# Patient Record
Sex: Male | Born: 1975 | Race: Asian | Hispanic: No | Marital: Married | Smoking: Never smoker
Health system: Southern US, Community
[De-identification: ages and names within clinical notes are randomized; demographics above are authoritative.]

---

## 2009-08-24 ENCOUNTER — Emergency Department (HOSPITAL_BASED_OUTPATIENT_CLINIC_OR_DEPARTMENT_OTHER): Admission: EM | Admit: 2009-08-24 | Discharge: 2009-08-24 | Payer: Self-pay | Admitting: Emergency Medicine

## 2011-03-17 ENCOUNTER — Ambulatory Visit (INDEPENDENT_AMBULATORY_CARE_PROVIDER_SITE_OTHER): Payer: BC Managed Care – PPO

## 2011-03-17 DIAGNOSIS — L251 Unspecified contact dermatitis due to drugs in contact with skin: Secondary | ICD-10-CM

## 2011-05-06 ENCOUNTER — Ambulatory Visit (INDEPENDENT_AMBULATORY_CARE_PROVIDER_SITE_OTHER): Payer: BC Managed Care – PPO

## 2011-05-06 DIAGNOSIS — J209 Acute bronchitis, unspecified: Secondary | ICD-10-CM

## 2011-09-15 ENCOUNTER — Ambulatory Visit (INDEPENDENT_AMBULATORY_CARE_PROVIDER_SITE_OTHER): Payer: BC Managed Care – PPO | Admitting: Emergency Medicine

## 2011-09-15 ENCOUNTER — Ambulatory Visit: Payer: BC Managed Care – PPO

## 2011-09-15 VITALS — BP 130/77 | HR 57 | Temp 98.1°F | Resp 16 | Ht 65.5 in | Wt 187.0 lb

## 2011-09-15 DIAGNOSIS — R05 Cough: Secondary | ICD-10-CM

## 2011-09-15 LAB — POCT CBC
Granulocyte percent: 66.3 %G (ref 37–80)
HCT, POC: 47 % (ref 43.5–53.7)
Hemoglobin: 15.2 g/dL (ref 14.1–18.1)
Lymph, poc: 1.8 (ref 0.6–3.4)
MCHC: 32.3 g/dL (ref 31.8–35.4)
MCV: 75.3 fL — AB (ref 80–97)
POC Granulocyte: 4.7 (ref 2–6.9)
POC LYMPH PERCENT: 25.5 %L (ref 10–50)
RDW, POC: 16.4 %

## 2011-09-15 LAB — POCT SEDIMENTATION RATE: POCT SED RATE: 10 mm/hr (ref 0–22)

## 2011-09-15 MED ORDER — AZITHROMYCIN 250 MG PO TABS: ORAL_TABLET | ORAL | Status: AC

## 2011-09-15 MED ORDER — ALBUTEROL SULFATE HFA 108 (90 BASE) MCG/ACT IN AERS: 2.0000 | INHALATION_SPRAY | RESPIRATORY_TRACT | Status: DC | PRN

## 2011-09-15 NOTE — Progress Notes (Signed)
  Subjective:    Patient ID: Ricky Jacobs, male    DOB: 12/26/1975, 36 y.o.   MRN: [id number]  HPI Mr. Dames comes in today for a cough for 1 week and no other symptoms.  He states that his left side of chest feels tight when he coughs but not painful. He has no SOB or wheezing.  He has had no congestion or ST.  No fever or chills, n/v/d.  He has taken OTC meds without relief.  He is a non smoker.     Review of Systems As noted in HPI    Objective:   Physical Exam  Constitutional: He is oriented to person, place, and time. He appears well-developed and well-nourished.  HENT:  Right Ear: Tympanic membrane normal.  Left Ear: Tympanic membrane normal.  Nose: Nose normal.  Mouth/Throat: Oropharynx is clear and moist.  Cardiovascular: Normal rate and regular rhythm.   Pulmonary/Chest: Effort normal and breath sounds normal.  Lymphadenopathy:    He has no cervical adenopathy.  Neurological: He is alert and oriented to person, place, and time.      Results for orders placed in visit on 09/15/11  POCT CBC      Component Value Range   WBC 7.1  4.6 - 10.2 (K/uL)   Lymph, poc 1.8  0.6 - 3.4    POC LYMPH PERCENT 25.5  10 - 50 (%L)   MID (cbc) 0.6  0 - 0.9    POC MID % 8.2  0 - 12 (%M)   POC Granulocyte 4.7  2 - 6.9    Granulocyte percent 66.3  37 - 80 (%G)   RBC 6.24 (*) 4.69 - 6.13 (M/uL)   Hemoglobin 15.2  14.1 - 18.1 (g/dL)   HCT, POC 40.9  81.1 - 53.7 (%)   MCV 75.3 (*) 80 - 97 (fL)   MCH, POC 24.4 (*) 27 - 31.2 (pg)   MCHC 32.3  31.8 - 35.4 (g/dL)   RDW, POC 91.4     Platelet Count, POC 291  142 - 424 (K/uL)   MPV 8.7  0 - 99.8 (fL)   [facility] reading (PRIMARY) by  Dr. Cleta Alberts CXR Prominent hilar markings    Assessment & Plan:  Cough  Sed Rate and ACE level Zpack and Albuterol Inhaler Watch for SOB, CP or worsening symptoms.  Discussed with Dr. Cleta Alberts

## 2011-09-16 LAB — ANGIOTENSIN CONVERTING ENZYME: Angiotensin-Converting Enzyme: 37 U/L (ref 8–52)

## 2012-04-04 ENCOUNTER — Ambulatory Visit: Payer: BC Managed Care – PPO

## 2012-04-04 ENCOUNTER — Ambulatory Visit (INDEPENDENT_AMBULATORY_CARE_PROVIDER_SITE_OTHER): Payer: BC Managed Care – PPO | Admitting: Family Medicine

## 2012-04-04 VITALS — BP 131/84 | HR 57 | Temp 97.9°F | Resp 16 | Ht 65.5 in | Wt 185.4 lb

## 2012-04-04 DIAGNOSIS — T148XXA Other injury of unspecified body region, initial encounter: Secondary | ICD-10-CM

## 2012-04-04 DIAGNOSIS — M542 Cervicalgia: Secondary | ICD-10-CM

## 2012-04-04 DIAGNOSIS — M545 Low back pain, unspecified: Secondary | ICD-10-CM

## 2012-04-04 DIAGNOSIS — M1990 Unspecified osteoarthritis, unspecified site: Secondary | ICD-10-CM

## 2012-04-04 DIAGNOSIS — M129 Arthropathy, unspecified: Secondary | ICD-10-CM

## 2012-04-04 MED ORDER — CYCLOBENZAPRINE HCL 5 MG PO TABS: 5.0000 mg | ORAL_TABLET | Freq: Every evening | ORAL | Status: DC | PRN

## 2012-04-04 MED ORDER — MELOXICAM 7.5 MG PO TABS: 7.5000 mg | ORAL_TABLET | Freq: Every day | ORAL | Status: DC | PRN
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
## 2012-04-05 ENCOUNTER — Encounter: Payer: Self-pay | Admitting: Family Medicine

## 2012-10-25 ENCOUNTER — Ambulatory Visit (INDEPENDENT_AMBULATORY_CARE_PROVIDER_SITE_OTHER): Payer: 59 | Admitting: Emergency Medicine

## 2012-10-25 VITALS — BP 110/80 | HR 70 | Temp 98.1°F | Resp 18 | Wt 191.0 lb

## 2012-10-25 DIAGNOSIS — J0100 Acute maxillary sinusitis, unspecified: Secondary | ICD-10-CM

## 2012-10-25 DIAGNOSIS — J018 Other acute sinusitis: Secondary | ICD-10-CM

## 2012-10-25 MED ORDER — AMOXICILLIN-POT CLAVULANATE 875-125 MG PO TABS: 1.0000 | ORAL_TABLET | Freq: Two times a day (BID) | ORAL | Status: DC

## 2012-10-25 MED ORDER — PSEUDOEPHEDRINE-GUAIFENESIN ER 60-600 MG PO TB12: 1.0000 | ORAL_TABLET | Freq: Two times a day (BID) | ORAL | Status: DC

## 2012-10-25 NOTE — Progress Notes (Signed)
  Subjective:     Ricky Jacobs is a 37 y.o. male who presents for evaluation of sinus pain. Symptoms include: cough, facial pain, frequent clearing of the throat, headaches, nasal congestion, post nasal drip, purulent rhinorrhea, sinus pressure and sore throat. Onset of symptoms was 1 week ago. Symptoms have been gradually worsening since that time. Past history is significant for no history of pneumonia or bronchitis. Patient is a non-smoker.  The following portions of the patient's history were reviewed and updated as appropriate: allergies, current medications, past family history, past medical history, past social history, past surgical history and problem list.  Review of Systems A comprehensive review of systems was negative.   Objective:    BP 110/80  Pulse 70  Temp(Src) 98.1 F (36.7 C) (Oral)  Resp 18  Wt 191 lb (86.637 kg)  BMI 31.29 kg/m2  SpO2 98%  General Appearance:    Alert, cooperative, no distress, appears stated age  Head:    Normocephalic, without obvious abnormality, atraumatic  Eyes:    PERRL, conjunctiva/corneas clear, EOM's intact, fundi    benign, both eyes       Ears:    Normal TM's and external ear canals, both ears  Nose:   Nares normal, septum midline, mucosa normal, no drainage    or sinus tenderness  Throat:   Lips, mucosa, and tongue normal; teeth and gums normal  Neck:   Supple, symmetrical, trachea midline, no adenopathy;       thyroid:  No enlargement/tenderness/nodules; no carotid   bruit or JVD  Back:     Symmetric, no curvature, ROM normal, no CVA tenderness  Lungs:     Clear to auscultation bilaterally, respirations unlabored  Chest wall:    No tenderness or deformity  Heart:    Regular rate and rhythm, S1 and S2 normal, no murmur, rub   or gallop  Abdomen:     Soft, non-tender, bowel sounds active all four quadrants,    no masses, no organomegaly        Extremities:   Extremities normal, atraumatic, no cyanosis or edema  Pulses:   2+ and  symmetric all extremities  Skin:   Skin color, texture, turgor normal, no rashes or lesions  Lymph nodes:   Cervical, supraclavicular, and axillary nodes normal  Neurologic:   CNII-XII intact. Normal strength, sensation and reflexes      throughout      Assessment:    Acute bacterial sinusitis.    Plan:    Augmentin per medication orders.

## 2012-10-25 NOTE — Patient Instructions (Addendum)

## 2013-06-19 ENCOUNTER — Ambulatory Visit (INDEPENDENT_AMBULATORY_CARE_PROVIDER_SITE_OTHER): Payer: 59 | Admitting: Family Medicine

## 2013-06-19 VITALS — BP 118/86 | HR 52 | Temp 98.3°F | Resp 16 | Ht 65.5 in | Wt 195.8 lb

## 2013-06-19 DIAGNOSIS — T7840XA Allergy, unspecified, initial encounter: Secondary | ICD-10-CM

## 2013-06-19 DIAGNOSIS — L299 Pruritus, unspecified: Secondary | ICD-10-CM

## 2013-06-19 MED ORDER — EPINEPHRINE 0.3 MG/0.3ML IJ SOAJ: 0.3000 mg | Freq: Once | INTRAMUSCULAR | Status: AC

## 2013-06-19 MED ORDER — PREDNISONE 20 MG PO TABS: ORAL_TABLET | ORAL | Status: DC

## 2013-06-19 NOTE — Patient Instructions (Signed)
You are having an allergic reaction to something- we may never be able to find out what caused this reaction!  Take the prednisone according to the directions. This should help with the rash and itching Keep an epipen on hand and look online for more information and videos https://www.epipen.com/en/about-epipen/how-to-use-epipen  Continue to use benadryl as needed for itching- remember this can make you feel sleepy. You can also take a zyrtec once a day, and a zantac once a day for about 2 weeks.  Let me know if you do not feel better soon- Sooner if worse.   IF you have swelling of your lips or tongue or cannot breathe use the epipen and call 911

## 2013-06-19 NOTE — Progress Notes (Signed)
[facility] [address], [place] [address] [phone]  Date:  06/19/2013   Name:  Adella NissenHan Norris   DOB:  08/29/1975   MRN:  [id number]  PCP:  No primary provider on file.    Chief Complaint: Pruritis   History of Present Illness:  Ricky Jacobs is a 38 y.o. very pleasant male patient who presents with the following:  He is here with itchy skin for the last 5 days.  It also seems that his skin will get red, and when he scratches he will have red spots.  He notes this all over his body. He has small patches of itching that move around Otherwise he feels well- no fever.   He is generally healthy.   No yard work.  No new medications.  He is not aware of any new contacts such as detergents, etc.  No recent antibiotic use No SOB, no cough, no swelling.   He has tried some benadryl- this helped a little bit.   No new foods that he can think of.    He has a history of allergies with runny nose, etc.     There are no active problems to display for this patient.   History reviewed. No pertinent past medical history.  History reviewed. No pertinent past surgical history.  History  Substance Use Topics  . Smoking status: Never Smoker   . Smokeless tobacco: Not on file  . Alcohol Use: No    No family history on file.  No Known Allergies  Medication list has been reviewed and updated.  No current outpatient prescriptions on file prior to visit.   No current facility-administered medications on file prior to visit.    Review of Systems:  As per HPI- otherwise negative.   Physical Examination: Filed Vitals:   06/19/13 1356  BP: 118/86  Pulse: 52  Temp: 98.3 F (36.8 C)  Resp: 16   Filed Vitals:   06/19/13 1356  Height: 5' 5.5" (1.664 m)  Weight: 195 lb 12.8 oz (88.814 kg)   Body mass index is 32.08 kg/(m^2). Ideal Body Weight: Weight in (lb) to have BMI = 25: 152.2  GEN: WDWN, NAD, Non-toxic, A & O x 3, looks well HEENT: Atraumatic,  Normocephalic. Neck supple. No masses, No LAD.  Bilateral TM wnl, oropharynx normal.  PEERL,EOMI.  No angioedema Ears and Nose: No external deformity. CV: RRR, No M/G/R. No JVD. No thrill. No extra heart sounds. PULM: CTA B, no wheezes, crackles, rhonchi. No retractions. No resp. distress. No accessory muscle use. EXTR: No c/c/e NEURO Normal gait.  PSYCH: Normally interactive. Conversant. Not depressed or anxious appearing.  Calm demeanor.  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  No urticaria.  No other rash  Assessment and Plan: Allergic reaction - Plan: predniSONE (DELTASONE) 20 MG tablet, EPINEPHrine (EPIPEN) 0.3 mg/0.3 mL SOAJ injection  Itching  Allergic reaction of unknown etiology. No evidence of dangerous reaction at this time Treat with prednisone, epipen to have in case of emergency See patient instructions for more details.    Meds ordered this encounter  Medications  . predniSONE (DELTASONE) 20 MG tablet    Sig: Take 2 pills a day for 4 days, then 1 pill a day for 4 days    Dispense:  12 tablet    Refill:  0  . EPINEPHrine (EPIPEN) 0.3  mg/0.3 mL SOAJ injection    Sig: Inject 0.3 mLs (0.3 mg total) into the muscle once.    Dispense:  2 Device    Refill:  2     Signed Abbe Amsterdam, MD

## 2013-08-19 ENCOUNTER — Encounter (HOSPITAL_BASED_OUTPATIENT_CLINIC_OR_DEPARTMENT_OTHER): Payer: Self-pay | Admitting: Emergency Medicine

## 2013-08-19 ENCOUNTER — Emergency Department (HOSPITAL_BASED_OUTPATIENT_CLINIC_OR_DEPARTMENT_OTHER)
Admission: EM | Admit: 2013-08-19 | Discharge: 2013-08-19 | Disposition: A | Discharge status: Home / Self Care | Payer: 59 | Attending: Emergency Medicine | Admitting: Emergency Medicine

## 2013-08-19 DIAGNOSIS — L509 Urticaria, unspecified: Secondary | ICD-10-CM | POA: Insufficient documentation

## 2013-08-19 MED ORDER — PREDNISONE 10 MG PO TABS: 20.0000 mg | ORAL_TABLET | Freq: Every day | ORAL | Status: DC

## 2013-08-19 MED ORDER — HYDROXYZINE HCL 25 MG PO TABS: 25.0000 mg | ORAL_TABLET | Freq: Four times a day (QID) | ORAL | Status: DC

## 2013-08-19 NOTE — Discharge Instructions (Signed)
Hives Hives are itchy, red, puffy (swollen) areas of the skin. Hives can change in size and location on your body. Hives can come and go for hours, days, or weeks. Hives do not spread from person to person (noncontagious). Scratching, exercise, and stress can make your hives worse. HOME CARE  Avoid things that cause your hives (triggers).  Take antihistamine medicines as told by your doctor. Do not drive while taking an antihistamine.  Take any other medicines for itching as told by your doctor.  Wear loose-fitting clothing.  Keep all doctor visits as told. GET HELP RIGHT AWAY IF:   You have a fever.  Your tongue or lips are puffy.  You have trouble breathing or swallowing.  You feel tightness in the throat or chest.  You have belly (abdominal) pain.  You have lasting or severe itching that is not helped by medicine.  You have painful or puffy joints. These problems may be the fiHypertension As your heart beats, it forces blood through your arteries. This force is your blood pressure. If the pressure is too high, it is called hypertension (HTN) or high blood pressure. HTN is dangerous because you may have it and not know it. High blood pressure may mean that your heart has to work harder to pump blood. Your arteries may be narrow or stiff. The extra work puts you at risk for heart disease, stroke, and other problems.  Blood pressure consists of two numbers, a higher number over a lower, 110/72, for example. It is stated as "110 over 72." The ideal is below 120 for the top number (systolic) and under 80 for the bottom (diastolic). Write down your blood pressure today. You should pay close attention to your blood pressure if you have certain conditions such as:  Heart failure.  Prior heart attack.  Diabetes  Chronic kidney disease.  Prior stroke.  Multiple risk factors for heart disease. To see if you have HTN, your blood pressure should be measured while you are seated with  your arm held at the level of the heart. It should be measured at least twice. A one-time elevated blood pressure reading (especially in the Emergency Department) does not mean that you need treatment. There may be conditions in which the blood pressure is different between your right and left arms. It is important to see your caregiver soon for a recheck. Most people have essential hypertension which means that there is not a specific cause. This type of high blood pressure may be lowered by changing lifestyle factors such as:  Stress.  Smoking.  Lack of exercise.  Excessive weight.  Drug/tobacco/alcohol use.  Eating less salt. Most people do not have symptoms from high blood pressure until it has caused damage to the body. Effective treatment can often prevent, delay or reduce that damage. TREATMENT  When a cause has been identified, treatment for high blood pressure is directed at the cause. There are a large number of medications to treat HTN. These fall into several categories, and your caregiver will help you select the medicines that are best for you. Medications may have side effects. You should review side effects with your caregiver. If your blood pressure stays high after you have made lifestyle changes or started on medicines,   Your medication(s) may need to be changed.  Other problems may need to be addressed.  Be certain you understand your prescriptions, and know how and when to take your medicine.  Be sure to follow up with your  caregiver within the time frame advised (usually within two weeks) to have your blood pressure rechecked and to review your medications.  If you are taking more than one medicine to lower your blood pressure, make sure you know how and at what times they should be taken. Taking two medicines at the same time can result in blood pressure that is too low. SEEK IMMEDIATE MEDICAL CARE IF:  You develop a severe headache, blurred or changing vision,  or confusion.  You have unusual weakness or numbness, or a faint feeling.  You have severe chest or abdominal pain, vomiting, or breathing problems. MAKE SURE YOU:   Understand these instructions.  Will watch your condition.  Will get help right away if you are not doing well or get worse. Document Released: 03/27/2005 Document Revised: 06/19/2011 Document Reviewed: 11/15/2007 [facility] Patient Information 2014 [place]. rst sign of a life-threatening allergic reaction. Call your local emergency services (911 in [place] MAKE SURE YOU:   Understand these instructions.  Will watch your condition.  Will get help right away if you are not doing well or get worse. Document Released: 01/04/2008 Document Revised: 09/26/2011 Document Reviewed: 06/20/2011 [facility] Patient Information 2014 [place], [place].

## 2013-08-19 NOTE — ED Provider Notes (Signed)
CSN: [id number]     Arrival date & time 08/19/13  [id number] History   First MD Initiated Contact with Patient 08/19/13 0920     Chief Complaint  Patient presents with  . Rash     (Consider location/radiation/quality/duration/timing/severity/associated sxs/prior Treatment) Patient is a 38 y.o. male presenting with rash. The history is provided by the patient.  Rash Location:  Full body Quality: itchiness and swelling   Severity:  Moderate Onset quality:  Sudden Timing:  Constant Progression:  Waxing and waning Chronicity:  Recurrent Context: not animal contact, not chemical exposure, not diapers, not eggs, not food, not hot tub use, not insect bite/sting, not medications, not new detergent/soap, not nuts, not plant contact, not pollen, not pregnancy, not sick contacts and not sun exposure   Relieved by:  Nothing (States benadryl does not help and was on prednisone without relief) Worsened by:  Heat Ineffective treatments:  Antihistamines Associated symptoms: no abdominal pain, no diarrhea, no fatigue, no fever, no headaches, no induration, no joint pain, no myalgias, no nausea, no periorbital edema, no shortness of breath, no sore throat, no throat swelling, no tongue swelling, no URI, not vomiting and not wheezing     History reviewed. No pertinent past medical history. History reviewed. No pertinent past surgical history. History reviewed. No pertinent family history. History  Substance Use Topics  . Smoking status: Never Smoker   . Smokeless tobacco: Not on file  . Alcohol Use: No    Review of Systems  Constitutional: Negative for fever and fatigue.  HENT: Negative for sore throat.   Respiratory: Negative for shortness of breath and wheezing.   Gastrointestinal: Negative for nausea, vomiting, abdominal pain and diarrhea.  Musculoskeletal: Negative for arthralgias and myalgias.  Skin: Positive for rash.  Neurological: Negative for headaches.  All other systems reviewed and are  negative.     Allergies  Review of patient's allergies indicates no known allergies.  Home Medications   Prior to Admission medications   Medication Sig Start Date End Date Taking? Authorizing Provider  EPINEPHrine (EPIPEN) 0.3 mg/0.3 mL SOAJ injection Inject 0.3 mLs (0.3 mg total) into the muscle once. 06/19/13   Pearline CablesJessica C Copland, MD  hydrOXYzine (ATARAX/VISTARIL) 25 MG tablet Take 1 tablet (25 mg total) by mouth every 6 (six) hours. 08/19/13   Hilario Quarryanielle S Valdemar Mcclenahan, MD  predniSONE (DELTASONE) 10 MG tablet Take 2 tablets (20 mg total) by mouth daily. 08/19/13   Hilario Quarryanielle S Laiken Sandy, MD  predniSONE (DELTASONE) 20 MG tablet Take 2 pills a day for 4 days, then 1 pill a day for 4 days 06/19/13   Gwenlyn FoundJessica C Copland, MD   BP 141/94  Pulse 48  Temp(Src) 98.2 F (36.8 C)  Resp 19  SpO2 100% Physical Exam  Nursing note and vitals reviewed. Constitutional: He is oriented to person, place, and time. He appears well-developed and well-nourished.  HENT:  Head: Normocephalic and atraumatic.  Right Ear: External ear normal.  Left Ear: External ear normal.  Nose: Nose normal.  Mouth/Throat: Oropharynx is clear and moist.  Eyes: Conjunctivae and EOM are normal. Pupils are equal, round, and reactive to light.  Neck: Normal range of motion. Neck supple.  Cardiovascular: Normal rate and normal heart sounds.   Pulmonary/Chest: Effort normal and breath sounds normal. He has no wheezes.  Abdominal: Soft. Bowel sounds are normal.  Musculoskeletal: Normal range of motion.  Neurological: He is alert and oriented to person, place, and time. He has normal reflexes.  Skin: Rash noted.  Scattered whelps with induration c.w. hives  Psychiatric: He has a normal mood and affect. His behavior is normal. Thought content normal.    ED Course  Procedures (including critical care time) Labs Review Labs Reviewed - No data to display  Imaging Review No results found.   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     Hilario Quarryanielle S Xitlally Mooneyham, MD 08/19/13 [id number]

## 2013-08-19 NOTE — ED Notes (Signed)
Patient c/o rash on entire body that itches, and has grown worse. Hurts more when he takes a warm shower or is sweating.  Took benadryl yesterday but no relief
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
## 2014-11-08 ENCOUNTER — Ambulatory Visit (INDEPENDENT_AMBULATORY_CARE_PROVIDER_SITE_OTHER): Payer: 59 | Admitting: Family Medicine

## 2014-11-08 VITALS — BP 116/70 | HR 54 | Temp 98.3°F | Resp 18 | Ht 66.0 in | Wt 184.0 lb

## 2014-11-08 DIAGNOSIS — H109 Unspecified conjunctivitis: Secondary | ICD-10-CM

## 2014-11-08 MED ORDER — POLYMYXIN B-TRIMETHOPRIM 10000-0.1 UNIT/ML-% OP SOLN: 1.0000 [drp] | OPHTHALMIC | Status: AC

## 2014-11-08 NOTE — Progress Notes (Signed)
[facility] [address] [place] [address] [phone] [phone]  Date:  11/08/2014   Name:  Ricky Jacobs   DOB:  1975-11-24   MRN:  [id number]  PCP:  No PCP Per Patient    Chief Complaint: Eye Pain   History of Present Illness:  This is a 39 y.o. male who is presenting with eye irritation and drainage x 4 days. States 4 days ago he was at work and sweat dripped in his right eye and ever since then he has had pain. Pain located to bottom of both eyes. Started in right eye, symptoms began in left eye today. Pain with blinking. States if he separated his eyelids from his eyeball, there is no pain. Eye redness only at the bottom. States there is white "mucus" coming out of eye. He denies fever, chills, eye itching, blurred vision. Nothing like this has ever happened before. Tried clear eyes and allergy drops and no help. Denies URI sx.  Review of Systems:  Review of Systems See HPI  There are no active problems to display for this patient.  Home meds: none  No Known Allergies  History reviewed. No pertinent past surgical history.  History  Substance Use Topics  . Smoking status: Never Smoker   . Smokeless tobacco: Not on file  . Alcohol Use: No    History reviewed. No pertinent family history.  Medication list has been reviewed and updated.  Physical Examination:  Physical Exam  Constitutional: He is oriented to person, place, and time. He appears well-developed and well-nourished. No distress.  HENT:  Head: Normocephalic and atraumatic.  Right Ear: Hearing, tympanic membrane, external ear and ear canal normal.  Left Ear: Hearing, tympanic membrane, external ear and ear canal normal.  Nose: Nose normal. Right sinus exhibits no maxillary sinus tenderness and no frontal sinus tenderness. Left sinus exhibits no maxillary sinus tenderness and no frontal sinus tenderness.  Mouth/Throat: Uvula is midline, oropharynx is clear and moist and mucous membranes are  normal.  Eyes: EOM are normal. Pupils are equal, round, and reactive to light. Right eye exhibits discharge (small amount, clear mucus). Left eye exhibits discharge (small amount, clear mucus). Right conjunctiva is injected (inferior aspect conjunctiva bilaterally). Left conjunctiva is injected. No scleral icterus.  Bilaterally small amount clear mucus adherent to inferior aspect cornea Woods lamp without corneal uptake Mucus removed easily with cotton swab. Eyes irrigated.  Pulmonary/Chest: Effort normal. No respiratory distress.  Musculoskeletal: Normal range of motion.  Neurological: He is alert and oriented to person, place, and time.  Skin: Skin is warm, dry and intact. No lesion and no rash noted.  Psychiatric: He has a normal mood and affect. His speech is normal and behavior is normal. Thought content normal.   BP 116/70 mmHg  Pulse 54  Temp(Src) 98.3 F (36.8 C) (Oral)  Resp 18  Ht 5\' 6"  (1.676 m)  Wt 184 lb (83.462 kg)  BMI 29.71 kg/m2  SpO2 97%   Visual Acuity Screening   Right eye Left eye Both eyes  Without correction: 20/15-2 20/15 20/15  With correction:       Assessment and Plan:  1. Bilateral conjunctivitis D/T mucus production and persistent eye irritation will treat with bacterial eye drops. Consulted with Dr. Katrinka Blazing who agrees. Counseled on warm compresses. Return in 1 week if not improving or at any time if symptoms worsen. - trimethoprim-polymyxin b (POLYTRIM) ophthalmic solution; Place 1 drop into both eyes every 4 (four) hours.  Dispense: 10 mL; Refill: 0   [place] [employer]. Dyke Brackett, MHS [facility]  11/08/2014

## 2014-11-08 NOTE — Progress Notes (Signed)
History and physical examinations obtained with PA Bush. Agree with assessment and plan. Kamica Florance Martin Teira Arcilla, M.D. [facility]  [place] [address] [place], [place]  [address] [phone] phone [phone] fax  

## 2014-11-08 NOTE — Patient Instructions (Signed)
Put one drop in each eye every 3 hours when awake for new 7 days. Apply warm compresses. Do not put any other drops in your eye. Return if your symptoms are not improving in 5-7 days.
